# Patient Record
Sex: Female | Born: 1945 | Race: Black or African American | Hispanic: No | Marital: Married | Smoking: Never smoker
Health system: Southern US, Community
[De-identification: ages and names within clinical notes are randomized; demographics above are authoritative.]

## PROBLEM LIST (undated history)

## (undated) DIAGNOSIS — E785 Hyperlipidemia, unspecified: Secondary | ICD-10-CM

## (undated) DIAGNOSIS — I10 Essential (primary) hypertension: Secondary | ICD-10-CM

## (undated) DIAGNOSIS — F419 Anxiety disorder, unspecified: Secondary | ICD-10-CM

---

## 2010-02-07 ENCOUNTER — Emergency Department (HOSPITAL_COMMUNITY): Admission: EM | Admit: 2010-02-07 | Discharge: 2010-02-07 | Payer: Self-pay | Admitting: Emergency Medicine

## 2010-02-10 ENCOUNTER — Emergency Department (HOSPITAL_COMMUNITY): Admission: EM | Admit: 2010-02-10 | Discharge: 2010-02-10 | Payer: Self-pay | Admitting: Emergency Medicine

## 2010-02-25 ENCOUNTER — Emergency Department (HOSPITAL_BASED_OUTPATIENT_CLINIC_OR_DEPARTMENT_OTHER): Admission: EM | Admit: 2010-02-25 | Discharge: 2010-02-25 | Payer: Self-pay | Admitting: Emergency Medicine

## 2010-02-25 ENCOUNTER — Ambulatory Visit: Payer: Self-pay | Admitting: Radiology

## 2010-02-28 ENCOUNTER — Emergency Department (HOSPITAL_BASED_OUTPATIENT_CLINIC_OR_DEPARTMENT_OTHER): Admission: EM | Admit: 2010-02-28 | Discharge: 2010-02-28 | Payer: Self-pay | Admitting: Emergency Medicine

## 2010-08-18 ENCOUNTER — Encounter: Admission: RE | Admit: 2010-08-18 | Discharge: 2010-08-18 | Payer: Self-pay | Admitting: Neurosurgery

## 2010-12-17 ENCOUNTER — Observation Stay (HOSPITAL_COMMUNITY)
Admission: EM | Admit: 2010-12-17 | Discharge: 2010-12-18 | Payer: Self-pay | Source: Home / Self Care | Attending: Family Medicine | Admitting: Family Medicine

## 2010-12-18 LAB — COMPREHENSIVE METABOLIC PANEL
Albumin: 3.8 g/dL (ref 3.5–5.2)
Calcium: 9.6 mg/dL (ref 8.4–10.5)
Chloride: 106 mEq/L (ref 96–112)
Creatinine, Ser: 1.09 mg/dL (ref 0.4–1.2)
Total Bilirubin: 0.8 mg/dL (ref 0.3–1.2)
Total Protein: 7 g/dL (ref 6.0–8.3)

## 2010-12-18 LAB — DIFFERENTIAL
Eosinophils Absolute: 0.2 10*3/uL (ref 0.0–0.7)
Lymphocytes Relative: 45 % (ref 12–46)
Lymphs Abs: 2.5 10*3/uL (ref 0.7–4.0)
Neutrophils Relative %: 43 % (ref 43–77)

## 2010-12-18 LAB — LIPID PANEL
HDL: 69 mg/dL (ref >39)
Total CHOL/HDL Ratio: 2.5 RATIO
Triglycerides: 127 mg/dL (ref <150)

## 2010-12-18 LAB — CBC
MCH: 30 pg (ref 26.0–34.0)
MCHC: 34.3 g/dL (ref 30.0–36.0)
Platelets: 320 10*3/uL (ref 150–400)

## 2010-12-18 LAB — CARDIAC PANEL(CRET KIN+CKTOT+MB+TROPI)
CK, MB: 1.4 ng/mL (ref 0.3–4.0)
CK, MB: 1.4 ng/mL (ref 0.3–4.0)
Relative Index: INVALID (ref 0.0–2.5)
Total CK: 83 U/L (ref 7–177)
Troponin I: 0.02 ng/mL (ref 0.00–0.06)

## 2010-12-18 LAB — POCT I-STAT, CHEM 8
BUN: 18 mg/dL (ref 6–23)
Calcium, Ion: 1.15 mmol/L (ref 1.12–1.32)
Glucose, Bld: 151 mg/dL — ABNORMAL HIGH (ref 70–99)
HCT: 44 % (ref 36.0–46.0)
TCO2: 28 mmol/L (ref 0–100)

## 2010-12-18 LAB — POCT CARDIAC MARKERS: Myoglobin, poc: 62.7 ng/mL (ref 12–200)

## 2010-12-18 LAB — HEMOGLOBIN A1C: Mean Plasma Glucose: 131 mg/dL — ABNORMAL HIGH (ref <117)

## 2010-12-18 LAB — PROTIME-INR: Prothrombin Time: 12.4 seconds (ref 11.6–15.2)

## 2010-12-18 LAB — CK TOTAL AND CKMB (NOT AT ARMC): CK, MB: 1.6 ng/mL (ref 0.3–4.0)

## 2010-12-19 LAB — BASIC METABOLIC PANEL
BUN: 15 mg/dL (ref 6–23)
Chloride: 109 mEq/L (ref 96–112)
Sodium: 143 mEq/L (ref 135–145)

## 2010-12-21 NOTE — H&P (Signed)
NAME:  Shelly Murphy, Shelly Murphy NO.:  [id number]  MEDICAL RECORD NO.:  [id number]          PATIENT TYPE:  OBV  LOCATION:  1825                         FACILITY:  [facility]  PHYSICIAN:  Randie Tallarico A. Sheffield Slider, M.D.    DATE OF BIRTH:  23-Jan-1946  DATE OF ADMISSION:  12/17/2010 DATE OF DISCHARGE:                             HISTORY & PHYSICAL   PRIMARY CARE PROVIDER:  Dr. Morrie Sheldon in [place], [place].  CHIEF COMPLAINT:  Chest pain.  HISTORY OF PRESENT ILLNESS:  This is a 65 year old African American female presenting with left-sided chest pain.  The pain is under the left breast, started acutely around 1700 on December 16, 2010 while she was eating her dinner.  The patient describes the pain as a "pressure" that she confirmed goes down her left arm when asked.  It is 5/10.  The patient is not reproducible or aggravated by breathing.  Lying down makes the pain worse.  The patient feels the pain may be attributable to"gas" and feels a good belch may alleviate it.  She is not sure whether the aspirin or the nitro the patient received in the ED made the pain better, though the pain was resolved at the time of interview.  The patient experienced some nausea when given the aspirin.  Also experiencing some "burning" down her legs.  She experienced similar burning while taking Zocor in the past; her reflux and burning stopped after discontinuing this medication.  She has been taking Lipitor for the past week.  Her last use was yesterday.  OTHER REVIEW OF SYSTEMS:  Palpitations, dysuria.  Negative review of systems for edema, vomiting, constipation (last bowel movement yesterday), abdominal pain, acid taste in mouth.  ALLERGIES:  AMOXICILLIN, NEXIUM, VITAMIN D ANALOG.  MEDICATIONS: 1. Allopurinol 300 mg daily. 2. Buspirone 10 mg t.i.d. 3. Lisinopril 20 mg daily. 4. KCl 40 mEq daily. 5. Prevacid questionable 15 mg daily. 6. Procardia 60 mg daily. 7. Lipitor 10 mg daily.  PAST MEDICAL  HISTORY: 1. Hypertension. 2. Anxiety. 3. GERD. 4. Gout. 5. Hypercholesterolemia. 6. Hypokalemia. 7. Meningioma, stable, last MRI September 2011, followed by Dr. Channing Mutters.  PAST SURGICAL HISTORY:  Colonic polyps removed several years ago, not cancerous but dysplastic.  SOCIAL HISTORY:  Lives alone in [place].  Daughter moved out a few months ago.  Unemployed currently, laid off from [employer] in 2009.  Denies tobacco, alcohol, and drugs.  FAMILY HISTORY:  Mother died of old age at age 10.  Father died from questionable stroke at 79.  Siblings; hypertension.  Sister with lymphoma.  No cardiac disease or diabetes.  PHYSICAL EXAMINATION:  VITAL SIGNS:  Temperature 98.5, heart rate 100, respiratory rate 20, blood pressure 140/56, 100% on room air. GENERAL:  Not in apparent distress, sitting comfortably up in bed. HEENT:  Moist mucous membranes. CARDIOVASCULAR:  Regular rate and rhythm, no murmurs or gallops. PULMONARY:  Clear to auscultation bilaterally.  No rales, nontender to palpation. ABDOMEN:  Obese, normoactive bowel sounds, soft, nontender, nondistended. EXTREMITIES:  Pedal pulses 2+; 0 to 1+ pretibial edema.  No tenderness to palpation.  Sensation and strength intact. NEURO:  Alert and oriented, grossly intact. SKIN:  Warm, 1- to 2-second cap refill.  Good skin turgor.  LABORATORY DATA AND STUDIES: 1. CBC; white blood count 5.6, hemoglobin 14.1, platelets 320. 2. INR 0.90, PT 12.4. 3. An iSTAT sodium 140, potassium 3.1, chloride 103, creatinine 1.3,     glucose 151. 4. Point-of-care cardiac enzymes less than 0.05. 5. Chest x-ray; no acute process, lungs clear, heart size normal. 6. ECG; heart rate is 100, normal sinus rhythm, left axis deviation,     normal P, QRS interval, ST and T.  ASSESSMENT AND PLAN:  This is a 65 year old African American female with a history of hypertension, anxiety, GERD, and hypercholesterolemia presenting with acute left-sided, radiating chest  pain. 1. Atypical chest pain.  Differential diagnoses:  Angina/myocardial     infarction, gastroesophageal reflux disease, anxiety.  Admit to     [facility] Medicine Teaching Service to floor bed on telemetry.  We     will rule out myocardial infarction by cycling cardiac enzymes and     repeating ECG in the a.m.  We will order an echo.  We will risk     stratify with hemoglobin A1c, fasting lipid panel, and TSH.  We     will start Protonix for gastroesophageal reflux disease.  We will     continue home buspirone for anxiety.  We will give Ativan p.r.n.     for anxiety. 2. Burning in lower extremities.  May be due to hypokalemia.  We will     give 40 mEq KCL x3.  We will check CMET now.  Unlikely a     possibility, but we will consider checking CK in case of     rhabdomyolysis from statin if pain does not disappear or gets     worse.  We will check LFTs now. 3. Dysuria.  We will check UA and urine culture. 4. Hypertension.  We will continue home lisinopril.  We will follow up     on home dose Procardia and restart.  We will give hydralazine for     systolic blood pressures greater than 180 as needed in the     meantime. 5. Gout.  We will continue home allopurinol. 6. Anxiety.  We will continue home buspirone.  Ativan p.r.n. 7. Hypercholesterolemia.  We will check fasting lipid panel.  We will     restart statin if LFTs are normal and suspicion for rhabdomyolysis     remains low. 8. FEB/GI.  Hypokalemia.  We will replete with 40 mEq KCL x3.  We will     check potassium in CMET now.  No IV fluids needed at this time.     Diet; regular, heart healthy. 9. Prophylaxis.  Heparin subcu for deep vein thrombosis prophylaxis.     Protonix for gastroesophageal reflux disease. 10.Disposition.  Pending negative cardiac workup.    ______________________________ [name], MD   ______________________________ Arnette Norris Sheffield Slider, M.D.    AO/MEDQ  D:  12/17/2010  T:  12/17/2010  Job:   [id number]  Electronically Signed by [name] MD on 12/21/2010 06:16:00 PM Electronically Signed by Zachery Dauer M.D. on 12/21/2010 07:28:39 PM

## 2010-12-25 NOTE — Discharge Summary (Signed)
NAME:  Shelly Murphy, LITTRELL NO.:  [id number]  MEDICAL RECORD NO.:  [id number]          PATIENT TYPE:  OBV  LOCATION:  2006                         FACILITY:  [facility]  PHYSICIAN:  [name] Lerry Cordrey, M.D.DATE OF BIRTH:  [birth year]  DATE OF ADMISSION:  12/17/2010 DATE OF DISCHARGE:  12/18/2010                              DISCHARGE SUMMARY   PRIMARY CARE PROVIDER:  Dr. Morrie Sheldon in [place], [place].  REASON FOR HOSPITALIZATION:  Left-sided chest pain.  DISCHARGE DIAGNOSES: 1. Chest pain, negative workup for myocardial infarction. 2. Gastroesophageal reflux disease. 3. Elevated A1c, At-Risk of Diabetes Mellitus Type 2.   DISCHARGE MEDICATIONS:  New medications: 1. Aspirin 81 mg p.o. daily. 2. Lisinopril 10 mg p.o. daily. 3. Nitroglycerin sublingual 0.4 mg sublingual q.5 minutes p.r.n. chest     pain up to 3 times. 4. Prevacid 30 mg p.o. q.a.m. for acid reflux.  Continued home medications: 1. Allopurinol 300 mg p.o. at noon. 2. Buspirone 10 mg p.o. b.i.d. 3. Ibuprofen 800 mg p.o. b.i.d. p.r.n. pain. 4. Lipitor 10 mg p.o. every evening. 5. Potassium chloride 40 mEq p.o. at noon. 6. Antifungal cream over-the-counter 1 application topically b.i.d.     under arms and under buttocks.  Discontinued medications: 1. Lisinopril/hydrochlorothiazide 20/25. 2. Procardia XL 60 mg.  CONSULTS:  None.  PROCEDURES:  ECHO showed ejection fraction of 65-70%.  Grade 1 diastolic dysfunction.  Good systolic function.  Mild mitral valve regurgitation.  PERTINENT LABORATORY DATA:  Potassium on admission was low at 3.1. Potassium went up was 3.9 at the time of discharge.  Cardiac enzymes were negative x3.  TSH 2.128.  Hemoglobin A1c mildly elevated at 6.2. Fasting lipid panel normal with cholesterol 170, triglyceride 127, HDL 69, LDL 76.  BRIEF HOSPITAL COURSE:  This is a 65 year old African American female with history of hypertension, GERD, anxiety, and  hyperlipidemia presenting with left-sided chest pain. 1. Chest pain rule out:  The patient was admitted to the [facility]     Medicine Teaching Service and given a floor bed on telemetry.  Her     cardiac enzymes were negative x3.  Her ECG showed normal sinus     rhythm with normal intervals and no ST changes or T-wave     abnormalities.  Her repeat ECG the next morning was unchanged from     previous.  An echo showed grade 1 diastolic dysfunction with mild     mitral valve regurgitation and good systolic function.  The     patient's TSH and fasting lipid panel were within normal limits.     The patient's chest pain was located under her left breast and was     not reproducible.  The chest pain resolved by hospital day #2.  A     GI cocktail may have helped the chest pain.  At the time of     discharge, the patient was started on a baby aspirin and her     Prevacid dose was doubled.  Her chest pain may be due to angina or     GERD.  It is recommended that the patient  have a stress test done     outside the hospital within the next few days.  The patient's     primary care provider, Dr. Morrie Sheldon was called and he said that he would     refer her for a stress test.  Since the patient does not have     insurance, it was thought that her PCP would be better able to     refer her for the stress test.  The patient has an appointment with     her PCP the day following the day of discharge. 2. Burning in bilateral lower extremities:  The patient reported a     burning sensation in her legs.  Her potassium was low on admission     at 3.1.  Her potassium was repleted with supplements and the     burning resolved. 3. Hypertension:  After the patient was admitted, the patient was only     started on a very low dose of her lisinopril.  The patient's blood     pressure did fine with systolics in the 110s to 130s and diastolics     in the 70s to 80s, just on this one medication.  The patient's     Procardia  and hydrochlorothiazide were held, and the patient was     asked to not take these medications at the time of discharge. 4. Gout:  The patient was not having any gout symptoms and was     continued on her home allopurinol. 5. Anxiety:  The patient has a history of anxiety and was continued on     her home buspirone. 6. Hypercholesterolemia:  The patient's pravastatin was initially held     due to concern that the burning in her lower extremities may be due     to myalgias as a result of the statin.  The patient's creatine     kinase levels were normal however and since the burning sensation     resolved with potassium repletion, the patient was told to continue     taking the statin at the time of discharge.  DISCHARGE INSTRUCTIONS:  The patient was asked to make an appointment with her PCP, Dr. Morrie Sheldon tomorrow, December 19, 2010.  Dr. Morrie Sheldon will refer the patient for a stress test to evaluate for angina.  The patient was also given a prescription for nitroglycerin and advised to take it as needed for chest pain and advised to come to the emergency room if her chest pain did not resolve with one dose of the nitroglycerin.  FOLLOWUP ISSUES: 1. Referral for stress test to evaluate for angina. 2. Consider repeat potassium as the patient's potassium was low on     admission despite the patient taking her supplements.  Consider     checking magnesium levels as possible cause for low potassium.  The patient was discharged home in stable medical condition.   ______________________________ Shelly Mann, MD   ______________________________ [name] Shelly Murphy, M.D.   AO/MEDQ  D:  12/18/2010  T:  12/19/2010  Job:  [id number]  cc:   Dr. Morrie Sheldon  Electronically Signed by Shelly Mann MD on 12/21/2010 06:16:16 PM Electronically Signed by Acquanetta Belling M.D. on 12/25/2010 12:04:30 PM

## 2010-12-30 ENCOUNTER — Emergency Department (HOSPITAL_COMMUNITY)
Admission: EM | Admit: 2010-12-30 | Discharge: 2010-12-30 | Disposition: A | Discharge status: Home / Self Care | Payer: Self-pay | Attending: Emergency Medicine | Admitting: Emergency Medicine

## 2010-12-30 DIAGNOSIS — R5383 Other fatigue: Secondary | ICD-10-CM | POA: Insufficient documentation

## 2010-12-30 DIAGNOSIS — Z79899 Other long term (current) drug therapy: Secondary | ICD-10-CM | POA: Insufficient documentation

## 2010-12-30 DIAGNOSIS — R002 Palpitations: Secondary | ICD-10-CM | POA: Insufficient documentation

## 2010-12-30 DIAGNOSIS — R209 Unspecified disturbances of skin sensation: Secondary | ICD-10-CM | POA: Insufficient documentation

## 2010-12-30 DIAGNOSIS — R6883 Chills (without fever): Secondary | ICD-10-CM | POA: Insufficient documentation

## 2010-12-30 DIAGNOSIS — I10 Essential (primary) hypertension: Secondary | ICD-10-CM | POA: Insufficient documentation

## 2010-12-30 DIAGNOSIS — R5381 Other malaise: Secondary | ICD-10-CM | POA: Insufficient documentation

## 2010-12-30 DIAGNOSIS — K219 Gastro-esophageal reflux disease without esophagitis: Secondary | ICD-10-CM | POA: Insufficient documentation

## 2010-12-30 DIAGNOSIS — R350 Frequency of micturition: Secondary | ICD-10-CM | POA: Insufficient documentation

## 2010-12-30 DIAGNOSIS — R197 Diarrhea, unspecified: Secondary | ICD-10-CM | POA: Insufficient documentation

## 2010-12-30 DIAGNOSIS — R1013 Epigastric pain: Secondary | ICD-10-CM | POA: Insufficient documentation

## 2010-12-30 DIAGNOSIS — E7800 Pure hypercholesterolemia, unspecified: Secondary | ICD-10-CM | POA: Insufficient documentation

## 2010-12-30 LAB — URINALYSIS, ROUTINE W REFLEX MICROSCOPIC
Bilirubin Urine: NEGATIVE
Ketones, ur: NEGATIVE mg/dL
Nitrite: NEGATIVE
pH: 6.5 (ref 5.0–8.0)

## 2010-12-30 LAB — POCT I-STAT, CHEM 8
Chloride: 107 mEq/L (ref 96–112)
Creatinine, Ser: 1.2 mg/dL (ref 0.4–1.2)
HCT: 41 % (ref 36.0–46.0)
Sodium: 141 mEq/L (ref 135–145)
TCO2: 26 mmol/L (ref 0–100)

## 2011-01-02 LAB — URINE CULTURE: Culture  Setup Time: 201202060927

## 2011-02-02 ENCOUNTER — Emergency Department (HOSPITAL_COMMUNITY)
Admission: EM | Admit: 2011-02-02 | Discharge: 2011-02-02 | Disposition: A | Discharge status: Home / Self Care | Payer: Self-pay | Attending: Emergency Medicine | Admitting: Emergency Medicine

## 2011-02-02 DIAGNOSIS — M79609 Pain in unspecified limb: Secondary | ICD-10-CM | POA: Insufficient documentation

## 2011-02-02 DIAGNOSIS — IMO0002 Reserved for concepts with insufficient information to code with codable children: Secondary | ICD-10-CM | POA: Insufficient documentation

## 2011-02-02 DIAGNOSIS — E7800 Pure hypercholesterolemia, unspecified: Secondary | ICD-10-CM | POA: Insufficient documentation

## 2011-02-02 DIAGNOSIS — K219 Gastro-esophageal reflux disease without esophagitis: Secondary | ICD-10-CM | POA: Insufficient documentation

## 2011-02-02 DIAGNOSIS — X58XXXA Exposure to other specified factors, initial encounter: Secondary | ICD-10-CM | POA: Insufficient documentation

## 2011-02-02 DIAGNOSIS — I10 Essential (primary) hypertension: Secondary | ICD-10-CM | POA: Insufficient documentation

## 2011-02-13 LAB — URINALYSIS, ROUTINE W REFLEX MICROSCOPIC
Bilirubin Urine: NEGATIVE
Ketones, ur: NEGATIVE mg/dL
Nitrite: NEGATIVE
Nitrite: NEGATIVE
Protein, ur: NEGATIVE mg/dL
Specific Gravity, Urine: 1.007 (ref 1.005–1.030)
Urobilinogen, UA: 0.2 mg/dL (ref 0.0–1.0)
Urobilinogen, UA: 0.2 mg/dL (ref 0.0–1.0)
pH: 7 (ref 5.0–8.0)

## 2011-02-13 LAB — COMPREHENSIVE METABOLIC PANEL
AST: 42 U/L — ABNORMAL HIGH (ref 0–37)
Albumin: 4 g/dL (ref 3.5–5.2)
Alkaline Phosphatase: 84 U/L (ref 39–117)
CO2: 28 mEq/L (ref 19–32)
Chloride: 107 mEq/L (ref 96–112)
Creatinine, Ser: 0.9 mg/dL (ref 0.4–1.2)
GFR calc Af Amer: >60 mL/min (ref >60)
GFR calc non Af Amer: >60 mL/min (ref >60)
Potassium: 3.3 mEq/L — ABNORMAL LOW (ref 3.5–5.1)
Total Bilirubin: 0.8 mg/dL (ref 0.3–1.2)

## 2011-02-13 LAB — BASIC METABOLIC PANEL
Calcium: 9.5 mg/dL (ref 8.4–10.5)
Chloride: 107 mEq/L (ref 96–112)
Creatinine, Ser: 0.8 mg/dL (ref 0.4–1.2)
GFR calc Af Amer: >60 mL/min (ref >60)
GFR calc non Af Amer: >60 mL/min (ref >60)

## 2011-02-13 LAB — DIFFERENTIAL
Basophils Absolute: 0.1 10*3/uL (ref 0.0–0.1)
Basophils Relative: 3 % — ABNORMAL HIGH (ref 0–1)
Eosinophils Absolute: 0 10*3/uL (ref 0.0–0.7)
Eosinophils Relative: 1 % (ref 0–5)
Lymphocytes Relative: 23 % (ref 12–46)
Lymphocytes Relative: 34 % (ref 12–46)
Lymphs Abs: 1.6 10*3/uL (ref 0.7–4.0)
Monocytes Absolute: 0.4 10*3/uL (ref 0.1–1.0)
Monocytes Relative: 8 % (ref 3–12)
Neutro Abs: 2.4 10*3/uL (ref 1.7–7.7)
Neutrophils Relative %: 53 % (ref 43–77)

## 2011-02-13 LAB — URINE CULTURE: Colony Count: 15000

## 2011-02-13 LAB — URINE MICROSCOPIC-ADD ON

## 2011-02-13 LAB — CBC
HCT: 42.5 % (ref 36.0–46.0)
MCV: 89.2 fL (ref 78.0–100.0)
MCV: 89.4 fL (ref 78.0–100.0)
Platelets: 314 10*3/uL (ref 150–400)
RBC: 4.76 MIL/uL (ref 3.87–5.11)
RBC: 4.87 MIL/uL (ref 3.87–5.11)
WBC: 4.6 10*3/uL (ref 4.0–10.5)
WBC: 6 10*3/uL (ref 4.0–10.5)

## 2011-02-13 LAB — MAGNESIUM: Magnesium: 1.9 mg/dL (ref 1.5–2.5)

## 2011-02-13 LAB — POCT CARDIAC MARKERS
Myoglobin, poc: 59.5 ng/mL (ref 12–200)
Troponin i, poc: <0.05 ng/mL (ref 0.00–0.09)

## 2011-02-13 LAB — D-DIMER, QUANTITATIVE: D-Dimer, Quant: 0.52 ug/mL-FEU — ABNORMAL HIGH (ref 0.00–0.48)

## 2011-02-18 LAB — POCT CARDIAC MARKERS
Myoglobin, poc: 88.8 ng/mL (ref 12–200)
Troponin i, poc: <0.05 ng/mL (ref 0.00–0.09)

## 2011-02-18 LAB — COMPREHENSIVE METABOLIC PANEL
ALT: 61 U/L — ABNORMAL HIGH (ref 0–35)
AST: 42 U/L — ABNORMAL HIGH (ref 0–37)
Albumin: 3.8 g/dL (ref 3.5–5.2)
Alkaline Phosphatase: 72 U/L (ref 39–117)
Alkaline Phosphatase: 72 U/L (ref 39–117)
BUN: 7 mg/dL (ref 6–23)
CO2: 29 mEq/L (ref 19–32)
CO2: 29 mEq/L (ref 19–32)
Calcium: 9.9 mg/dL (ref 8.4–10.5)
Chloride: 100 mEq/L (ref 96–112)
GFR calc Af Amer: >60 mL/min (ref >60)
GFR calc non Af Amer: 56 mL/min — ABNORMAL LOW (ref >60)
GFR calc non Af Amer: >60 mL/min (ref >60)
Glucose, Bld: 146 mg/dL — ABNORMAL HIGH (ref 70–99)
Potassium: 2.8 mEq/L — ABNORMAL LOW (ref 3.5–5.1)
Potassium: 2.8 mEq/L — ABNORMAL LOW (ref 3.5–5.1)
Sodium: 140 mEq/L (ref 135–145)
Total Bilirubin: 1 mg/dL (ref 0.3–1.2)
Total Protein: 7.8 g/dL (ref 6.0–8.3)

## 2011-02-18 LAB — DIFFERENTIAL
Basophils Absolute: 0 10*3/uL (ref 0.0–0.1)
Basophils Relative: 0 % (ref 0–1)
Basophils Relative: 1 % (ref 0–1)
Eosinophils Absolute: 0 10*3/uL (ref 0.0–0.7)
Eosinophils Relative: 1 % (ref 0–5)
Lymphs Abs: 1.6 10*3/uL (ref 0.7–4.0)
Monocytes Absolute: 0.3 10*3/uL (ref 0.1–1.0)
Monocytes Relative: 9 % (ref 3–12)
Neutro Abs: 2.1 10*3/uL (ref 1.7–7.7)
Neutrophils Relative %: 56 % (ref 43–77)

## 2011-02-18 LAB — URINALYSIS, ROUTINE W REFLEX MICROSCOPIC
Glucose, UA: NEGATIVE mg/dL
Hgb urine dipstick: NEGATIVE
Ketones, ur: NEGATIVE mg/dL
Protein, ur: NEGATIVE mg/dL

## 2011-02-18 LAB — CBC
HCT: 43 % (ref 36.0–46.0)
Hemoglobin: 15 g/dL (ref 12.0–15.0)
Hemoglobin: 15.1 g/dL — ABNORMAL HIGH (ref 12.0–15.0)
MCHC: 33.5 g/dL (ref 30.0–36.0)
RBC: 4.78 MIL/uL (ref 3.87–5.11)
RBC: 4.97 MIL/uL (ref 3.87–5.11)
WBC: 3.7 10*3/uL — ABNORMAL LOW (ref 4.0–10.5)
WBC: 4.2 10*3/uL (ref 4.0–10.5)

## 2011-02-18 LAB — URINE MICROSCOPIC-ADD ON

## 2011-03-11 ENCOUNTER — Emergency Department (HOSPITAL_COMMUNITY)
Admission: EM | Admit: 2011-03-11 | Discharge: 2011-03-11 | Disposition: A | Discharge status: Home / Self Care | Payer: Self-pay | Attending: Emergency Medicine | Admitting: Emergency Medicine

## 2011-03-11 ENCOUNTER — Emergency Department (HOSPITAL_COMMUNITY): Payer: Self-pay

## 2011-03-11 DIAGNOSIS — D320 Benign neoplasm of cerebral meninges: Secondary | ICD-10-CM | POA: Insufficient documentation

## 2011-03-11 DIAGNOSIS — E7800 Pure hypercholesterolemia, unspecified: Secondary | ICD-10-CM | POA: Insufficient documentation

## 2011-03-11 DIAGNOSIS — I10 Essential (primary) hypertension: Secondary | ICD-10-CM | POA: Insufficient documentation

## 2011-03-11 DIAGNOSIS — R51 Headache: Secondary | ICD-10-CM | POA: Insufficient documentation

## 2011-03-11 DIAGNOSIS — I4460 Unspecified fascicular block: Secondary | ICD-10-CM | POA: Insufficient documentation

## 2011-03-11 DIAGNOSIS — R42 Dizziness and giddiness: Secondary | ICD-10-CM | POA: Insufficient documentation

## 2011-03-11 LAB — URINALYSIS, ROUTINE W REFLEX MICROSCOPIC
Glucose, UA: NEGATIVE mg/dL
pH: 7 (ref 5.0–8.0)

## 2011-03-11 LAB — POCT CARDIAC MARKERS
CKMB, poc: <1 ng/mL — ABNORMAL LOW (ref 1.0–8.0)
Myoglobin, poc: 69.2 ng/mL (ref 12–200)
Myoglobin, poc: 53.7 ng/mL (ref 12–200)
Troponin i, poc: <0.05 ng/mL (ref 0.00–0.09)

## 2011-03-11 LAB — DIFFERENTIAL
Basophils Relative: 1 % (ref 0–1)
Eosinophils Absolute: 0 10*3/uL (ref 0.0–0.7)
Eosinophils Relative: 1 % (ref 0–5)
Monocytes Relative: 9 % (ref 3–12)
Neutrophils Relative %: 64 % (ref 43–77)

## 2011-03-11 LAB — POCT I-STAT, CHEM 8
BUN: 20 mg/dL (ref 6–23)
Chloride: 101 mEq/L (ref 96–112)
HCT: 49 % — ABNORMAL HIGH (ref 36.0–46.0)
Sodium: 141 mEq/L (ref 135–145)
TCO2: 30 mmol/L (ref 0–100)

## 2011-03-11 LAB — CBC
HCT: 46.2 % — ABNORMAL HIGH (ref 36.0–46.0)
Hemoglobin: 16.1 g/dL — ABNORMAL HIGH (ref 12.0–15.0)
MCV: 86.8 fL (ref 78.0–100.0)
RBC: 5.32 MIL/uL — ABNORMAL HIGH (ref 3.87–5.11)

## 2011-03-11 MED ORDER — GADOBENATE DIMEGLUMINE 529 MG/ML IV SOLN
20.0000 mL | Freq: Once | INTRAVENOUS | Status: AC | PRN
  Administered 2011-03-11: 20 mL via INTRAVENOUS

## 2011-08-01 IMAGING — CT CT HEAD W/O CM
3 of 4 series · 16 of 40 positions shown, 19 images · non-contrast
Comparison: None.

CT HEAD

CLINICAL DATA: Numbness in face and hand, neck pain for 2 months

CT HEAD WITHOUT CONTRAST
CT CERVICAL SPINE WITHOUT CONTRAST
TECHNIQUE: Multidetector CT imaging of the head and cervical spine
was performed following the standard protocol without intravenous
contrast.  Multiplanar CT image reconstructions of the cervical
spine were also generated.

[Series 3: head_seq 4.5 h37s st · axial · 0.43mm/px · z∈[+1246,+1282]mm · 2 of 32 slices shown]
[im 8/32  brain]
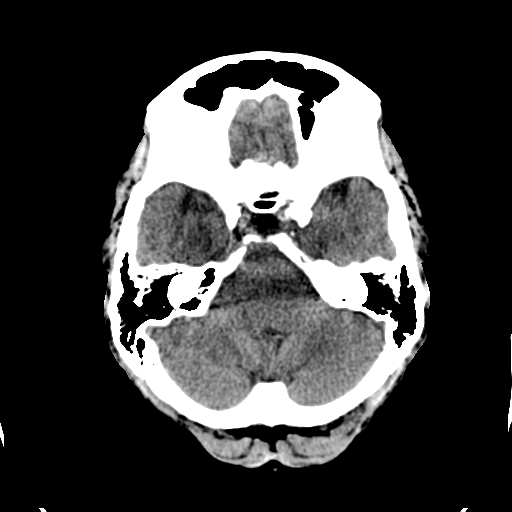
[im 16/32  brain]
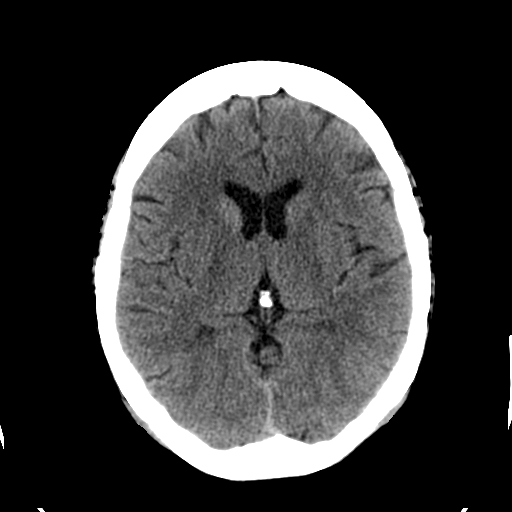

[Series 602: <mpr thick range> · coronal · 0.31mm/px · 3 of 49 slices shown]
[im 17/49  brain]
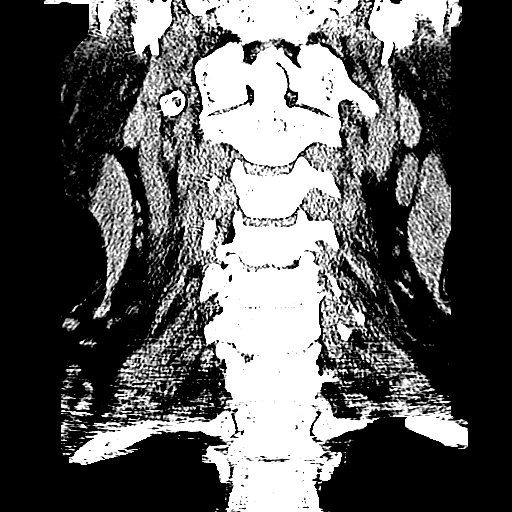
[im 22/49  brain]
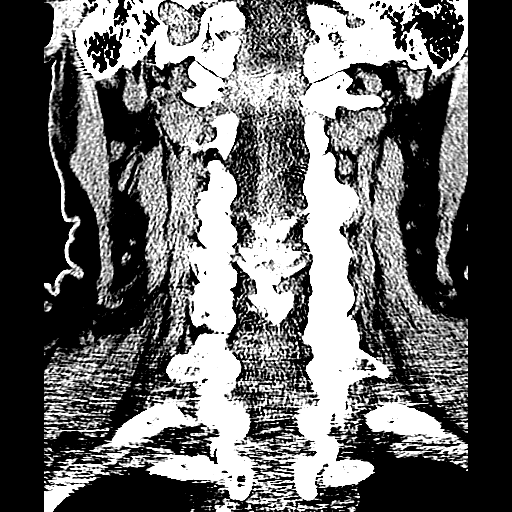
[im 27/49  brain]
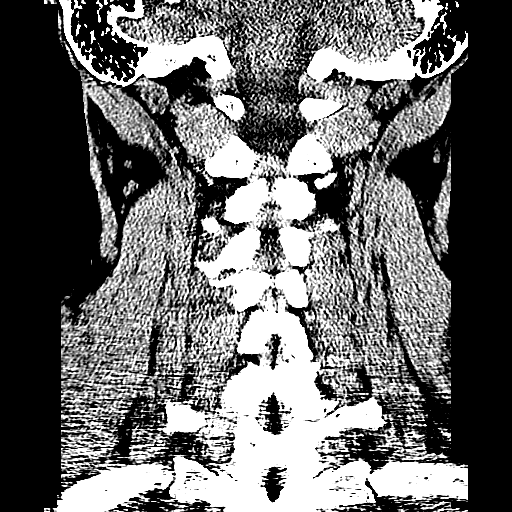

[Series 603: <mpr thick range(1)> · axial · 0.31mm/px · z∈[+1037,+1152]mm · 11 of 82 slices shown, 14 images]
[im 7/82  brain]
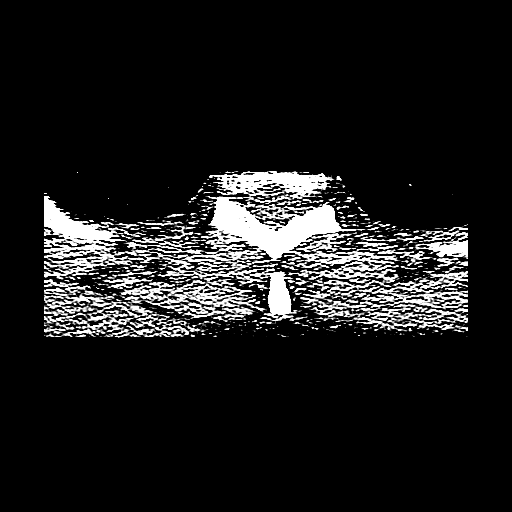
[im 7/82  bone]
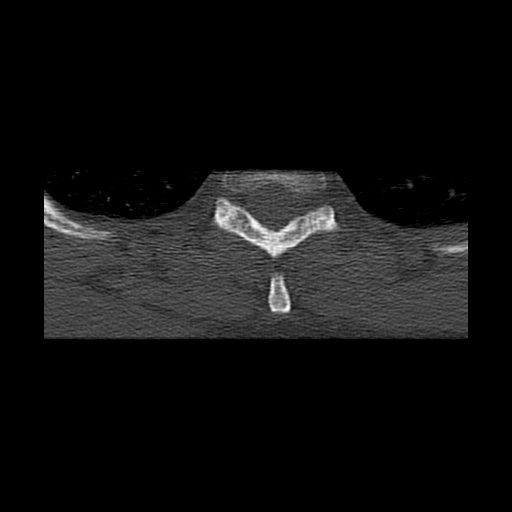
[im 14/82  brain]
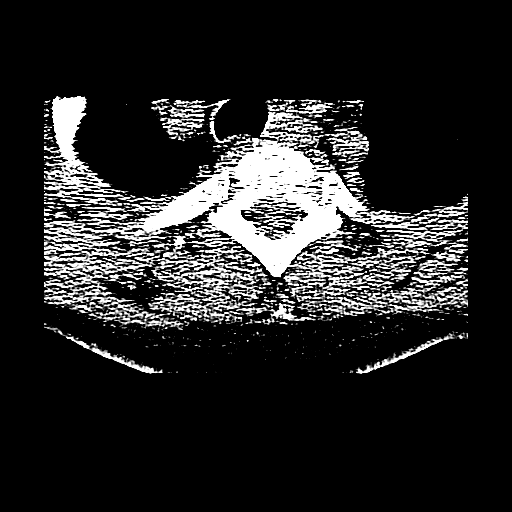
[im 21/82  brain]
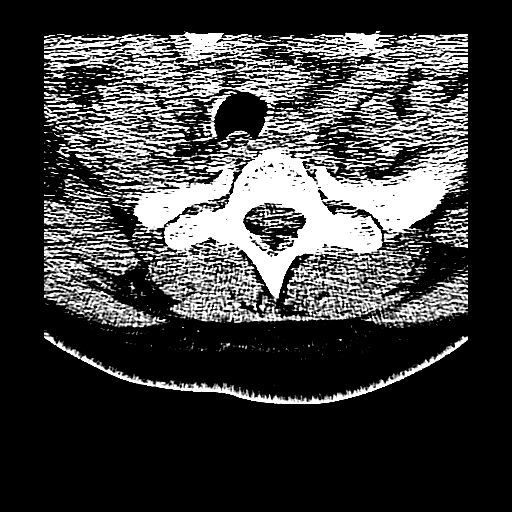
[im 28/82  brain]
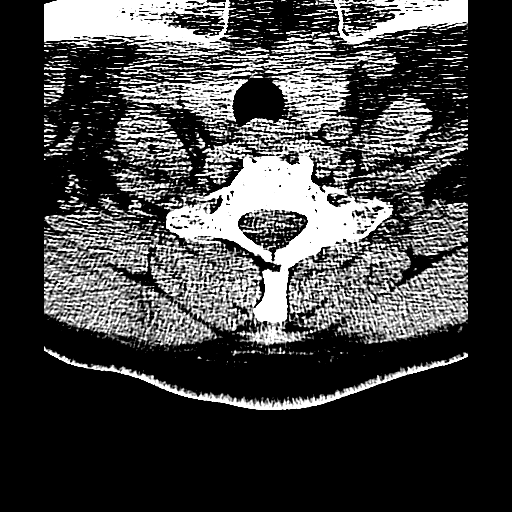
[im 34/82  brain]
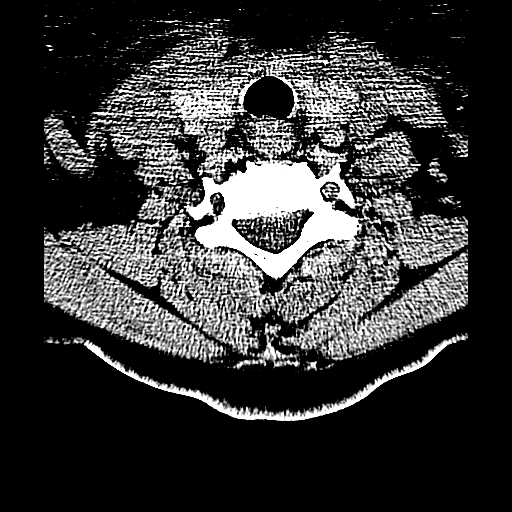
[im 34/82  bone]
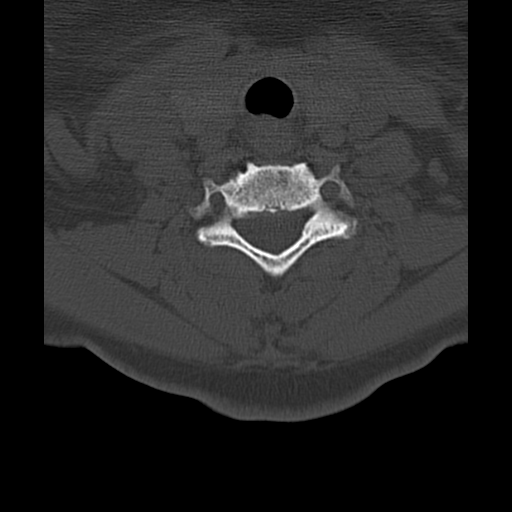
[im 41/82  brain]
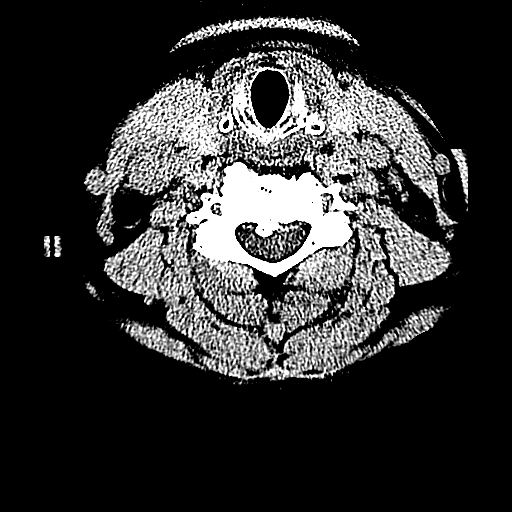
[im 48/82  brain]
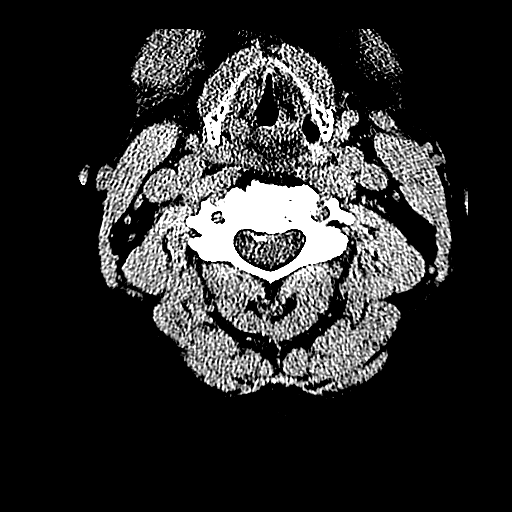
[im 55/82  brain]
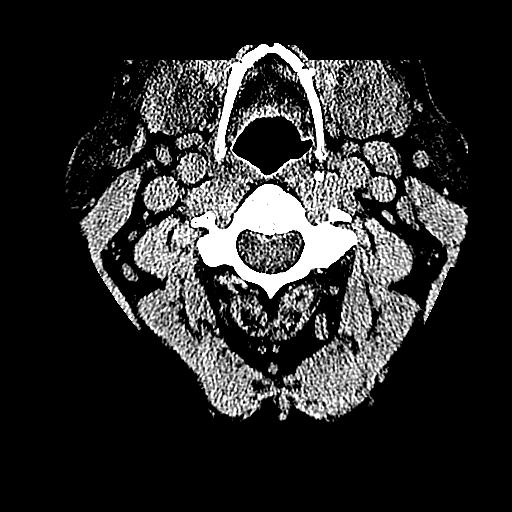
[im 61/82  brain]
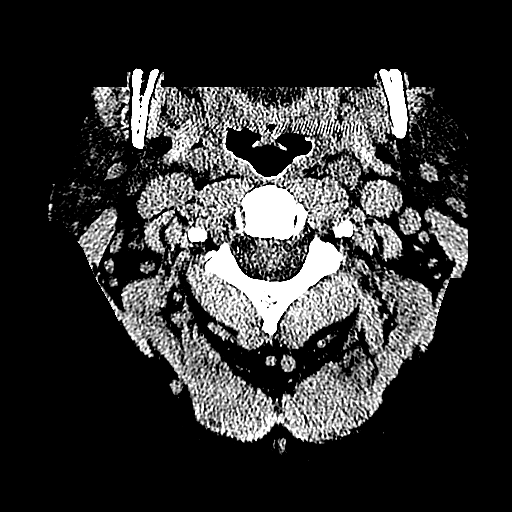
[im 61/82  bone]
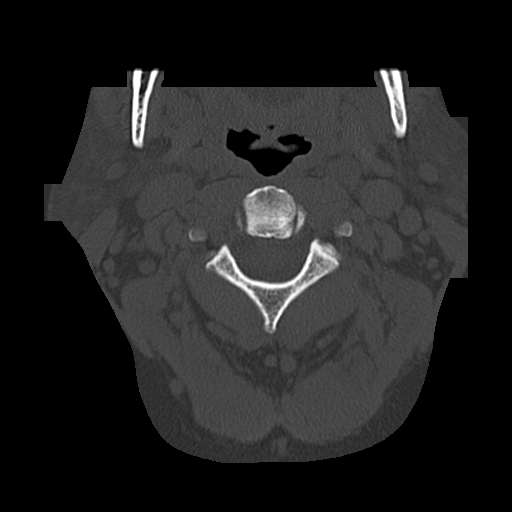
[im 68/82  brain]
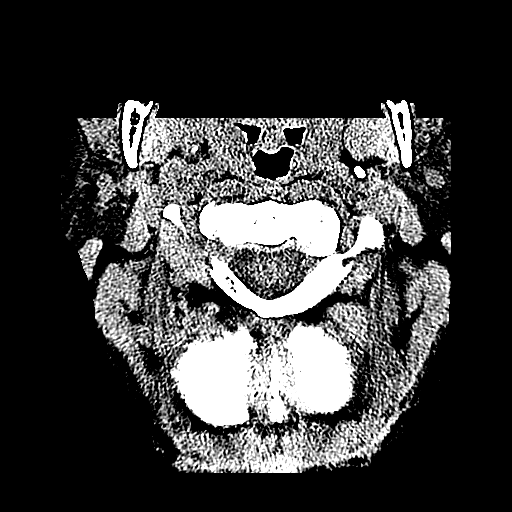
[im 75/82  brain]
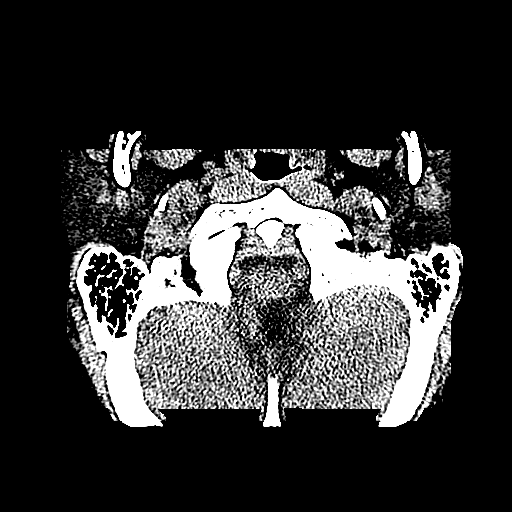

[16 of 40 positions shown; findings below may reference images not displayed]

FINDINGS: No evidence of acute intracranial hemorrhage.  There is a
flame shaped low attenuation region within the subcortical white
matter of the medial right frontal lobe just inferior to the
ventricle which is asymmetric.  Paramedian increased attenuation
adjacent is likely volume averaging from bony excrescence at the
sella.    No evidence of mass effect in this region.  No
hydrocephalus or midline shift.

Paranasal sinuses and  mastoid air cells are clear.  Orbits are
normal.
IMPRESSION: 1.  White matter hypoattenuation within the right frontal lobe
could represent an acute white matter infarction.  Cannot complete
exclude edema from occult mass.  Recommend brain MRI with and
without contrast for further evaluation.

CT CERVICAL SPINE
FINDINGS: No prevertebral soft tissue swelling.  There is
straightening of the normal cervical lordosis.  There is loss of
disc space and osteophytosis most severe at the C4-C6.  Normal
facet articulation.  Normal craniocervical junction.  No evidence
of epidural or paraspinal hematoma.
IMPRESSION: 1.  No acute cervical spine findings.
2.  Severe disc osteophytic disease with anterior and posterior
osteophytes from C4-C6.

## 2011-08-04 IMAGING — CR DG CHEST 2V
2 series · 2 of 2 positions shown · non-contrast
Comparison: None.

CLINICAL DATA: Epigastric pain

CHEST - 2 VIEW

[w chest pa]
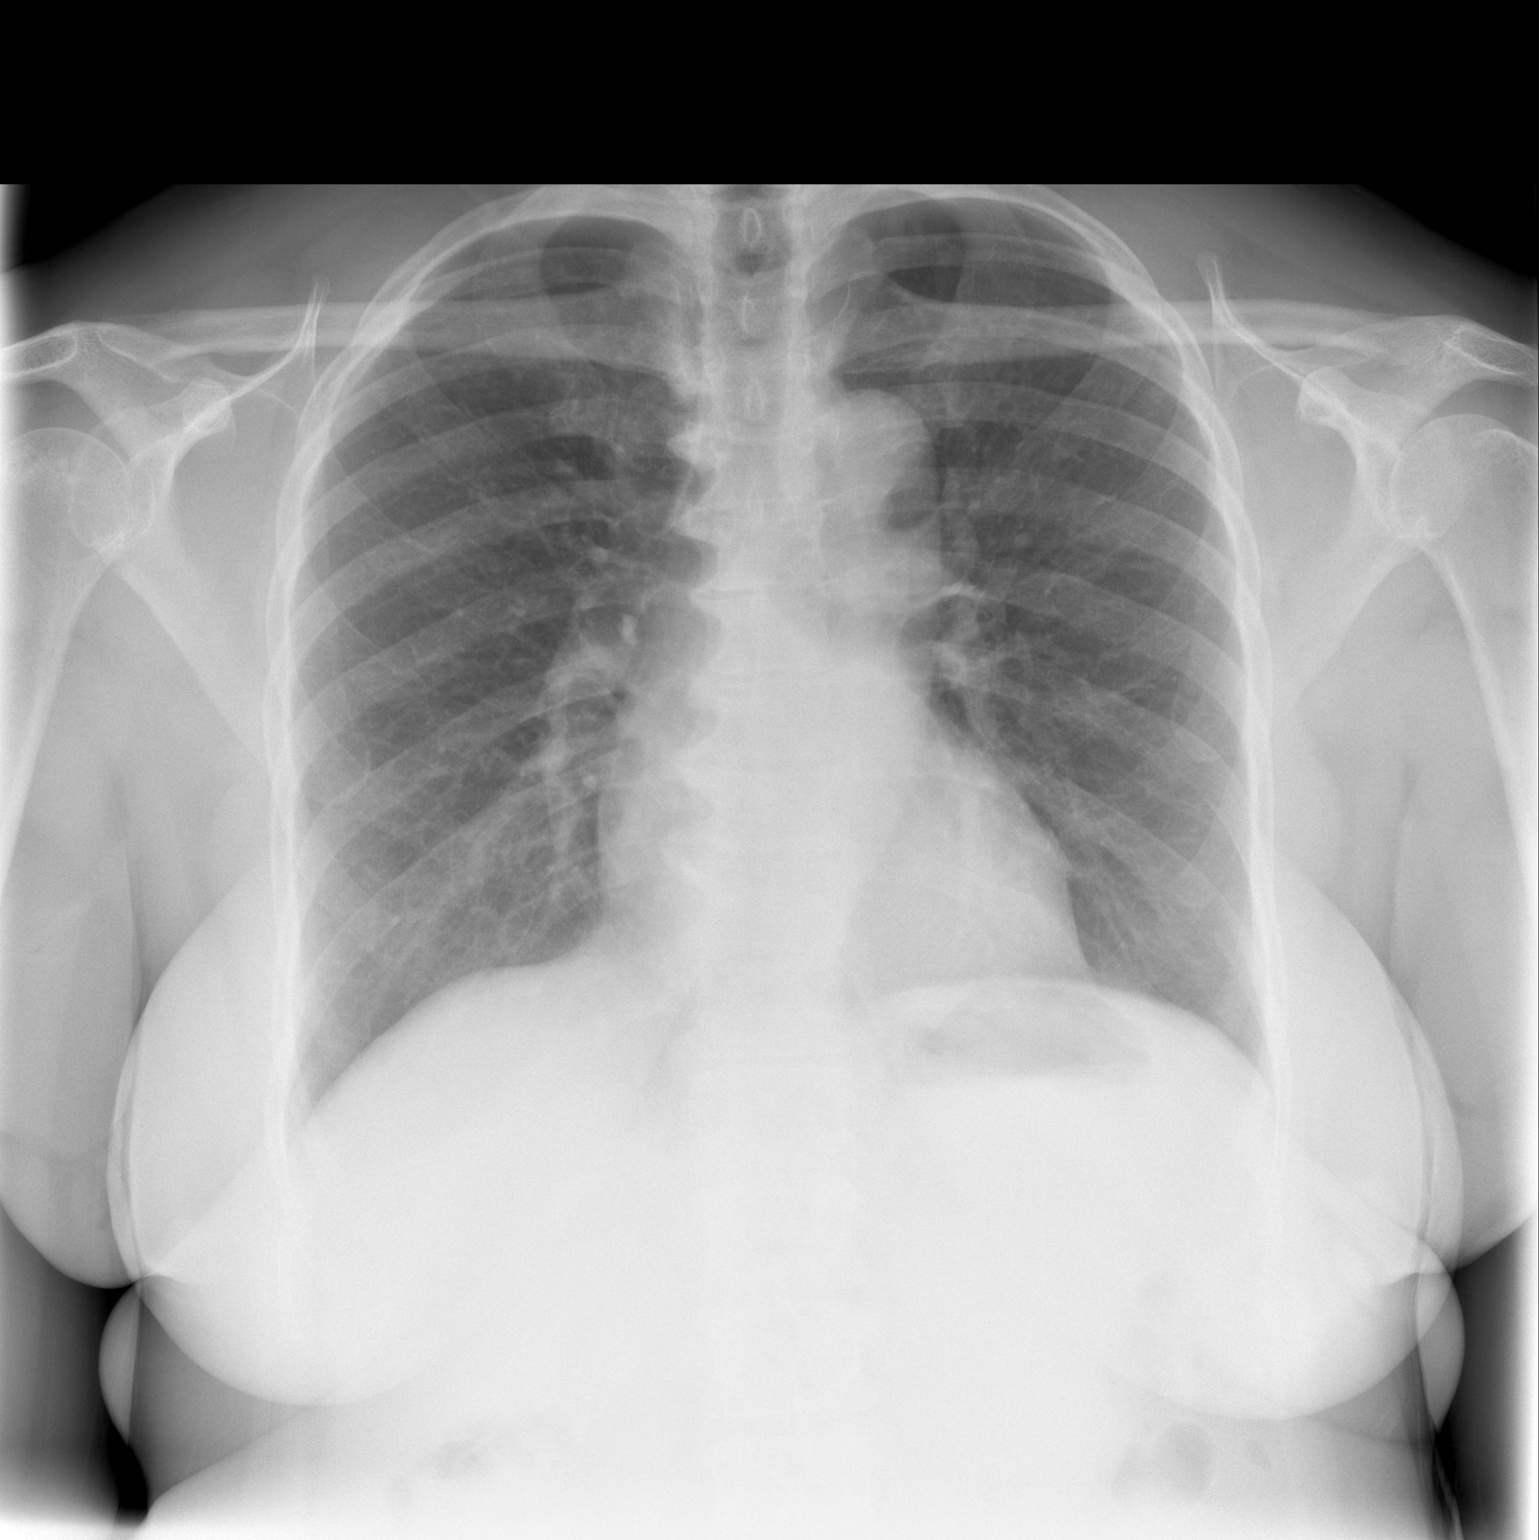

[w chest lat]
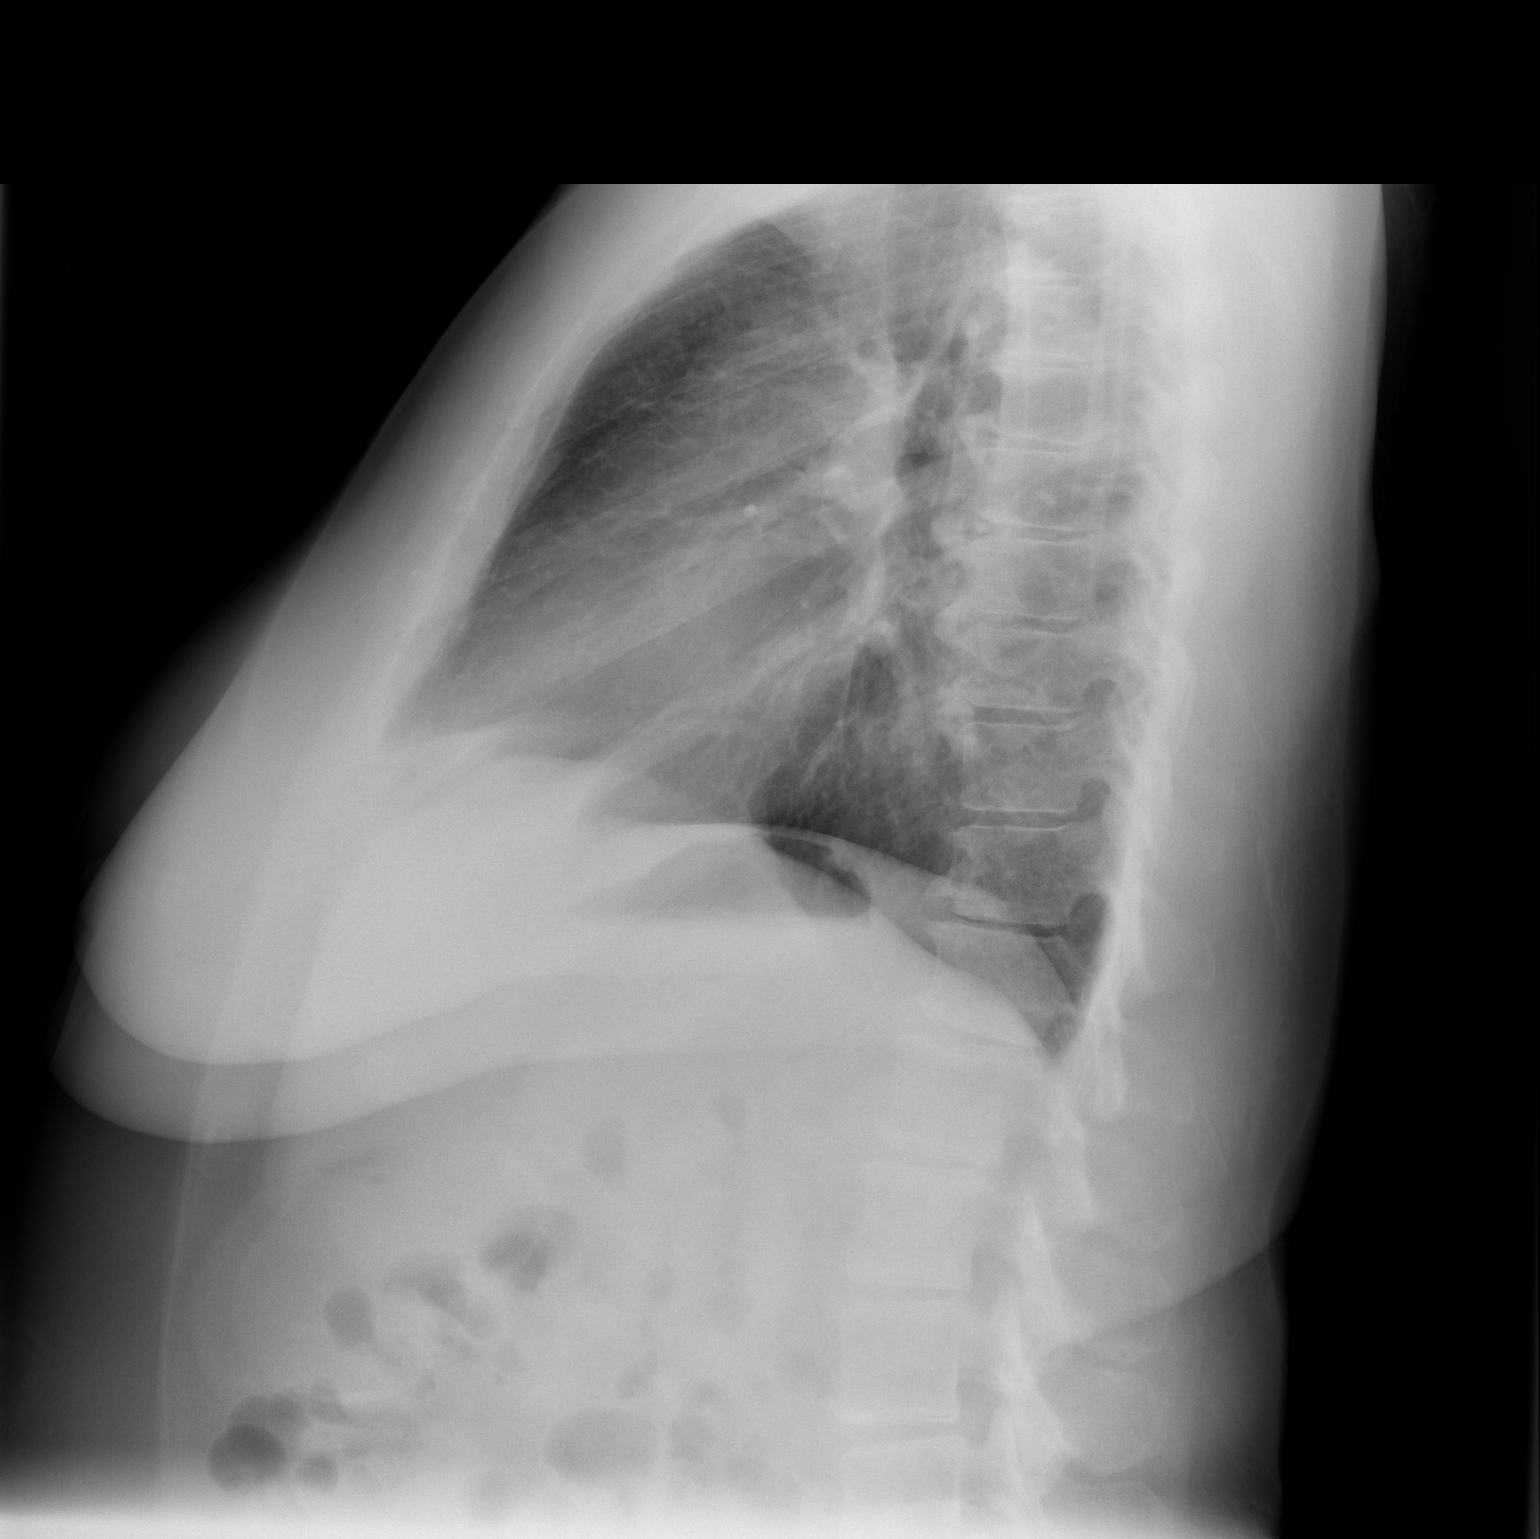

[2 of 2 positions shown; findings below may reference images not displayed]

FINDINGS: The lungs are clear.  The heart is within normal limits
in size.  There are diffuse degenerative changes throughout the
thoracic spine.
IMPRESSION: No active lung disease.

## 2012-02-13 ENCOUNTER — Emergency Department (HOSPITAL_COMMUNITY)
Admission: EM | Admit: 2012-02-13 | Discharge: 2012-02-13 | Disposition: A | Discharge status: Home Health | Payer: Self-pay | Attending: Emergency Medicine | Admitting: Emergency Medicine

## 2012-02-13 ENCOUNTER — Encounter (HOSPITAL_COMMUNITY): Payer: Self-pay | Admitting: Physical Medicine and Rehabilitation

## 2012-02-13 DIAGNOSIS — I10 Essential (primary) hypertension: Secondary | ICD-10-CM | POA: Insufficient documentation

## 2012-02-13 DIAGNOSIS — E785 Hyperlipidemia, unspecified: Secondary | ICD-10-CM | POA: Insufficient documentation

## 2012-02-13 DIAGNOSIS — M791 Myalgia, unspecified site: Secondary | ICD-10-CM

## 2012-02-13 DIAGNOSIS — IMO0001 Reserved for inherently not codable concepts without codable children: Secondary | ICD-10-CM | POA: Insufficient documentation

## 2012-02-13 DIAGNOSIS — M109 Gout, unspecified: Secondary | ICD-10-CM | POA: Insufficient documentation

## 2012-02-13 HISTORY — DX: Anxiety disorder, unspecified: F41.9

## 2012-02-13 HISTORY — DX: Essential (primary) hypertension: I10

## 2012-02-13 HISTORY — DX: Hyperlipidemia, unspecified: E78.5

## 2012-02-13 LAB — POCT I-STAT, CHEM 8
BUN: 19 mg/dL (ref 6–23)
Chloride: 106 mEq/L (ref 96–112)
Creatinine, Ser: 1 mg/dL (ref 0.50–1.10)
Hemoglobin: 15.6 g/dL — ABNORMAL HIGH (ref 12.0–15.0)
Potassium: 4.3 mEq/L (ref 3.5–5.1)
Sodium: 142 mEq/L (ref 135–145)

## 2012-02-13 MED ORDER — NAPROXEN 500 MG PO TABS: 500.0000 mg | ORAL_TABLET | Freq: Two times a day (BID) | ORAL | Status: AC

## 2012-02-13 MED ORDER — NAPROXEN 500 MG PO TABS
500.0000 mg | ORAL_TABLET | Freq: Once | ORAL | Status: AC
  Administered 2012-02-13: 500 mg via ORAL
  Filled 2012-02-13: qty 1

## 2012-02-13 MED ORDER — DIAZEPAM 2 MG PO TABS: 2.0000 mg | ORAL_TABLET | Freq: Four times a day (QID) | ORAL | Status: AC | PRN

## 2012-02-13 NOTE — ED Notes (Signed)
Pt presents to department for evaluation of L sided shoulder pain. Onset Tuesday. Pt states cramping/muscles aches to L shoulder. Pain became worse this morning. 9/10 pain, becomes worse with movement. Denies recent injury. Ambulatory to triage. No signs of distress noted.

## 2012-02-13 NOTE — ED Notes (Signed)
Pt states frequent myalgia from taking cholesterol medication.

## 2012-02-13 NOTE — ED Provider Notes (Signed)
Medical screening examination/treatment/procedure(s) were performed by non-physician practitioner and as supervising physician I was immediately available for consultation/collaboration.   Jiah Bari A. Patrica Duel, MD 02/13/12 2149

## 2012-02-13 NOTE — Discharge Instructions (Signed)
Please use the medication as prescribed and follow up with your doctor tomorrow as planned.  Read the information below.  You may return to the ER at any time for worsening condition or any new symptoms that concern you.   Musculoskeletal Pain Musculoskeletal pain is muscle and boney aches and pains. These pains can occur in any part of the body. Your caregiver may treat you without knowing the cause of the pain. They may treat you if blood or urine tests, X-rays, and other tests were normal.  CAUSES There is often not a definite cause or reason for these pains. These pains may be caused by a type of germ (virus). The discomfort may also come from overuse. Overuse includes working out too hard when your body is not fit. Boney aches also come from weather changes. Bone is sensitive to atmospheric pressure changes. HOME CARE INSTRUCTIONS   Ask when your test results will be ready. Make sure you get your test results.   Only take over-the-counter or prescription medicines for pain, discomfort, or fever as directed by your caregiver. If you were given medications for your condition, do not drive, operate machinery or power tools, or sign legal documents for 24 hours. Do not drink alcohol. Do not take sleeping pills or other medications that may interfere with treatment.   Continue all activities unless the activities cause more pain. When the pain lessens, slowly resume normal activities. Gradually increase the intensity and duration of the activities or exercise.   During periods of severe pain, bed rest may be helpful. Lay or sit in any position that is comfortable.   Putting ice on the injured area.   Put ice in a bag.   Place a towel between your skin and the bag.   Leave the ice on for 15 to 20 minutes, 3 to 4 times a day.   Follow up with your caregiver for continued problems and no reason can be found for the pain. If the pain becomes worse or does not go away, it may be necessary to  repeat tests or do additional testing. Your caregiver may need to look further for a possible cause.  SEEK IMMEDIATE MEDICAL CARE IF:  You have pain that is getting worse and is not relieved by medications.   You develop chest pain that is associated with shortness or breath, sweating, feeling sick to your stomach (nauseous), or throw up (vomit).   Your pain becomes localized to the abdomen.   You develop any new symptoms that seem different or that concern you.  MAKE SURE YOU:   Understand these instructions.   Will watch your condition.   Will get help right away if you are not doing well or get worse.  Document Released: 11/11/2005 Document Revised: 10/31/2011 Document Reviewed: 07/01/2008 [facility] Patient Information 2012 [place], [place].

## 2012-02-13 NOTE — ED Provider Notes (Signed)
History     CSN: [id number]  Arrival date & time 02/13/12  1003   First MD Initiated Contact with Patient 02/13/12 1150      Chief Complaint  Patient presents with  . Shoulder Pain    (Consider location/radiation/quality/duration/timing/severity/associated sxs/prior treatment) HPI Comments: Patient reports left upper back pain x 2 days.  States the pain is constant and is worse with moving her arm, attempting to raise her arms.  Pain is described as a soreness.  States that she has been on various statin drugs in the past and switched to a new one a year ago because it was causing extreme muscle aches and cramps.  States that this feeling is similar but not as severe.  Has an appointment with her PCP in [place], [place] tomorrow but could not stand the discomfort any longer.  Patient has not taken anything for her pain.  Denies fevers, chest pain, SOB, recent injury or heavy lifting, falls or accidents.  Denies muscle pain elsewhere.    The history is provided by the patient.    Past Medical History  Diagnosis Date  . Hypertension   . Anxiety   . Gout   . Hyperlipemia     History reviewed. No pertinent past surgical history.  History reviewed. No pertinent family history.  History  Substance Use Topics  . Smoking status: Never Smoker   . Smokeless tobacco: Not on file  . Alcohol Use: No    OB History    Grav Para Term Preterm Abortions TAB SAB Ect Mult Living                  Review of Systems  All other systems reviewed and are negative.    Allergies  Vitamin d analogs  Home Medications   Current Outpatient Rx  Name Route Sig Dispense Refill  . ALLOPURINOL 300 MG PO TABS Oral Take 300 mg by mouth daily.    . ASPIRIN EC 81 MG PO TBEC Oral Take 81 mg by mouth daily.    . ATORVASTATIN CALCIUM 10 MG PO TABS Oral Take 10 mg by mouth at bedtime.    . BUSPIRONE HCL 10 MG PO TABS Oral Take 10 mg by mouth 2 (two) times daily.    Marland Kitchen LANSOPRAZOLE 15 MG PO CPDR Oral Take 15  mg by mouth daily.    Marland Kitchen LISINOPRIL-HYDROCHLOROTHIAZIDE 20-25 MG PO TABS Oral Take 1 tablet by mouth daily.    Marland Kitchen MAGNESIUM HYDROXIDE 800 MG/5ML PO SUSP Oral Take 30 mLs by mouth daily as needed. gas    . POTASSIUM CHLORIDE CRYS ER 20 MEQ PO TBCR Oral Take 40 mEq by mouth daily.      BP 127/70  Pulse 92  Temp(Src) 97.9 F (36.6 C) (Oral)  Resp 18  SpO2 100%  Physical Exam  Nursing note and vitals reviewed. Constitutional: She is oriented to person, place, and time. She appears well-developed and well-nourished.  HENT:  Head: Normocephalic and atraumatic.  Neck: Neck supple.  Pulmonary/Chest: Effort normal. She exhibits no tenderness.  Musculoskeletal: She exhibits tenderness. She exhibits no edema.       Left shoulder: She exhibits decreased range of motion. She exhibits no bony tenderness, no swelling, no effusion, no crepitus, no deformity, normal pulse and normal strength.       Arms:      Bilateral grip strengths equal.  Decreased strength of left shoulder only with elevation, otherwise normal.    Neurological: She is alert and oriented  to person, place, and time.    ED Course  Procedures (including critical care time)  Labs Reviewed  POCT I-STAT, CHEM 8 - Abnormal; Notable for the following:    Glucose, Bld 103 (*)    Hemoglobin 15.6 (*)    All other components within normal limits   No results found.   1. Muscle soreness       MDM  Patient with isolated muscular soreness of left upper back associated with shoulder movement and with palpation.  Patient has not suffered any injury, but has had similar symptoms from statin.  She has an appointment with her PCP tomorrow for follow up.  Patient is not having any chest pain and SOB or other concerning symptoms.  She has no spinal tenderness.  Doubt radiculopathy, doubt ACS, doubt pulmonary pathology.  Pt d/c home with NSAID and muscle relaxant.  Patient verbalizes understanding and agrees with plan.          Rise Patience, [place] 02/13/12 1601

## 2013-06-04 ENCOUNTER — Encounter: Payer: Self-pay | Admitting: Internal Medicine

## 2013-07-13 ENCOUNTER — Encounter: Payer: Self-pay | Admitting: Internal Medicine

## 2013-07-28 ENCOUNTER — Encounter: Payer: Self-pay | Admitting: Internal Medicine

## 2013-08-11 ENCOUNTER — Other Ambulatory Visit: Payer: Self-pay | Admitting: Neurosurgery

## 2013-08-11 DIAGNOSIS — D329 Benign neoplasm of meninges, unspecified: Secondary | ICD-10-CM

## 2013-08-19 ENCOUNTER — Ambulatory Visit
Admission: RE | Admit: 2013-08-19 | Discharge: 2013-08-19 | Disposition: A | Discharge status: Home / Self Care | Payer: Medicare Other | Source: Ambulatory Visit | Attending: Neurosurgery | Admitting: Neurosurgery

## 2013-08-19 DIAGNOSIS — D329 Benign neoplasm of meninges, unspecified: Secondary | ICD-10-CM

## 2013-08-19 MED ORDER — GADOBENATE DIMEGLUMINE 529 MG/ML IV SOLN
19.0000 mL | Freq: Once | INTRAVENOUS | Status: AC | PRN
  Administered 2013-08-19: 19 mL via INTRAVENOUS

## 2014-02-15 ENCOUNTER — Emergency Department (HOSPITAL_COMMUNITY): Payer: Medicare Other

## 2014-02-15 ENCOUNTER — Encounter (HOSPITAL_COMMUNITY): Payer: Self-pay | Admitting: Emergency Medicine

## 2014-02-15 ENCOUNTER — Emergency Department (HOSPITAL_COMMUNITY)
Admission: EM | Admit: 2014-02-15 | Discharge: 2014-02-15 | Disposition: A | Discharge status: Home / Self Care | Payer: Medicare Other | Attending: Emergency Medicine | Admitting: Emergency Medicine

## 2014-02-15 DIAGNOSIS — R5383 Other fatigue: Secondary | ICD-10-CM

## 2014-02-15 DIAGNOSIS — R300 Dysuria: Secondary | ICD-10-CM | POA: Insufficient documentation

## 2014-02-15 DIAGNOSIS — R0789 Other chest pain: Secondary | ICD-10-CM | POA: Insufficient documentation

## 2014-02-15 DIAGNOSIS — R42 Dizziness and giddiness: Secondary | ICD-10-CM | POA: Insufficient documentation

## 2014-02-15 DIAGNOSIS — R079 Chest pain, unspecified: Secondary | ICD-10-CM

## 2014-02-15 DIAGNOSIS — Z7982 Long term (current) use of aspirin: Secondary | ICD-10-CM | POA: Insufficient documentation

## 2014-02-15 DIAGNOSIS — R0602 Shortness of breath: Secondary | ICD-10-CM | POA: Insufficient documentation

## 2014-02-15 DIAGNOSIS — R259 Unspecified abnormal involuntary movements: Secondary | ICD-10-CM | POA: Insufficient documentation

## 2014-02-15 DIAGNOSIS — R5381 Other malaise: Secondary | ICD-10-CM | POA: Insufficient documentation

## 2014-02-15 DIAGNOSIS — Z79899 Other long term (current) drug therapy: Secondary | ICD-10-CM | POA: Insufficient documentation

## 2014-02-15 DIAGNOSIS — R51 Headache: Secondary | ICD-10-CM | POA: Insufficient documentation

## 2014-02-15 DIAGNOSIS — E785 Hyperlipidemia, unspecified: Secondary | ICD-10-CM | POA: Insufficient documentation

## 2014-02-15 DIAGNOSIS — Z8659 Personal history of other mental and behavioral disorders: Secondary | ICD-10-CM | POA: Insufficient documentation

## 2014-02-15 DIAGNOSIS — R531 Weakness: Secondary | ICD-10-CM

## 2014-02-15 DIAGNOSIS — R251 Tremor, unspecified: Secondary | ICD-10-CM

## 2014-02-15 DIAGNOSIS — I10 Essential (primary) hypertension: Secondary | ICD-10-CM | POA: Insufficient documentation

## 2014-02-15 LAB — BASIC METABOLIC PANEL
BUN: 28 mg/dL — AB (ref 6–23)
CO2: 23 mEq/L (ref 19–32)
CREATININE: 1.23 mg/dL — AB (ref 0.50–1.10)
Calcium: 10.3 mg/dL (ref 8.4–10.5)
Chloride: 99 mEq/L (ref 96–112)
GFR calc Af Amer: 52 mL/min — ABNORMAL LOW (ref >90)
GFR calc non Af Amer: 45 mL/min — ABNORMAL LOW (ref >90)
GLUCOSE: 107 mg/dL — AB (ref 70–99)
POTASSIUM: 3.7 meq/L (ref 3.7–5.3)
Sodium: 138 mEq/L (ref 137–147)

## 2014-02-15 LAB — CBC
HEMATOCRIT: 40.7 % (ref 36.0–46.0)
HEMOGLOBIN: 14.2 g/dL (ref 12.0–15.0)
MCH: 30.4 pg (ref 26.0–34.0)
MCHC: 34.9 g/dL (ref 30.0–36.0)
MCV: 87.2 fL (ref 78.0–100.0)
Platelets: 291 10*3/uL (ref 150–400)
RBC: 4.67 MIL/uL (ref 3.87–5.11)
RDW: 12.9 % (ref 11.5–15.5)
WBC: 5 10*3/uL (ref 4.0–10.5)

## 2014-02-15 LAB — I-STAT TROPONIN, ED: Troponin i, poc: 0 ng/mL (ref 0.00–0.08)

## 2014-02-15 MED ORDER — ESOMEPRAZOLE MAGNESIUM 40 MG PO CPDR: 40.0000 mg | DELAYED_RELEASE_CAPSULE | Freq: Every day | ORAL | Status: AC

## 2014-02-15 NOTE — Discharge Instructions (Signed)
Your caregiver has diagnosed you as having chest pain that is not specific for one problem, but does not require admission.  You are at low risk for an acute heart condition or other serious illness. Chest pain comes from many different causes.  SEEK IMMEDIATE MEDICAL ATTENTION IF: You have severe chest pain, especially if the pain is crushing or pressure-like and spreads to the arms, back, neck, or jaw, or if you have sweating, nausea (feeling sick to your stomach), or shortness of breath. THIS IS AN EMERGENCY. Don't wait to see if the pain will go away. Get medical help at once. Call 911 or 0 (operator). DO NOT drive yourself to the hospital.  Your chest pain gets worse and does not go away with rest.  You have an attack of chest pain lasting longer than usual, despite rest and treatment with the medications your caregiver has prescribed.  You wake from sleep with chest pain or shortness of breath.  You feel dizzy or faint.  You have chest pain not typical of your usual pain for which you originally saw your caregiver.  Chest Pain (Nonspecific) It is often hard to give a specific diagnosis for the cause of chest pain. There is always a chance that your pain could be related to something serious, such as a heart attack or a blood clot in the lungs. You need to follow up with your caregiver for further evaluation. CAUSES   Heartburn.  Pneumonia or bronchitis.  Anxiety or stress.  Inflammation around your heart (pericarditis) or lung (pleuritis or pleurisy).  A blood clot in the lung.  A collapsed lung (pneumothorax). It can develop suddenly on its own (spontaneous pneumothorax) or from injury (trauma) to the chest.  Shingles infection (herpes zoster virus). The chest wall is composed of bones, muscles, and cartilage. Any of these can be the source of the pain.  The bones can be bruised by injury.  The muscles or cartilage can be strained by coughing or overwork.  The cartilage can be  affected by inflammation and become sore (costochondritis). DIAGNOSIS  Lab tests or other studies, such as X-rays, electrocardiography, stress testing, or cardiac imaging, may be needed to find the cause of your pain.  TREATMENT   Treatment depends on what may be causing your chest pain. Treatment may include:  Acid blockers for heartburn.  Anti-inflammatory medicine.  Pain medicine for inflammatory conditions.  Antibiotics if an infection is present.  You may be advised to change lifestyle habits. This includes stopping smoking and avoiding alcohol, caffeine, and chocolate.  You may be advised to keep your head raised (elevated) when sleeping. This reduces the chance of acid going backward from your stomach into your esophagus.  Most of the time, nonspecific chest pain will improve within 2 to 3 days with rest and mild pain medicine. HOME CARE INSTRUCTIONS   If antibiotics were prescribed, take your antibiotics as directed. Finish them even if you start to feel better.  For the next few days, avoid physical activities that bring on chest pain. Continue physical activities as directed.  Do not smoke.  Avoid drinking alcohol.  Only take over-the-counter or prescription medicine for pain, discomfort, or fever as directed by your caregiver.  Follow your caregiver's suggestions for further testing if your chest pain does not go away.  Keep any follow-up appointments you made. If you do not go to an appointment, you could develop lasting (chronic) problems with pain. If there is any problem keeping an appointment,  you must call to reschedule. SEEK MEDICAL CARE IF:   You think you are having problems from the medicine you are taking. Read your medicine instructions carefully.  Your chest pain does not go away, even after treatment.  You develop a rash with blisters on your chest. SEEK IMMEDIATE MEDICAL CARE IF:   You have increased chest pain or pain that spreads to your arm,  neck, jaw, back, or abdomen.  You develop shortness of breath, an increasing cough, or you are coughing up blood.  You have severe back or abdominal pain, feel nauseous, or vomit.  You develop severe weakness, fainting, or chills.  You have a fever. THIS IS AN EMERGENCY. Do not wait to see if the pain will go away. Get medical help at once. Call your local emergency services (911 in [place] Do not drive yourself to the hospital. MAKE SURE YOU:   Understand these instructions.  Will watch your condition.  Will get help right away if you are not doing well or get worse. Document Released: 08/21/2005 Document Revised: 02/03/2012 Document Reviewed: 06/16/2008 [facility] Patient Information 2014 [place].  Weakness Weakness is a lack of strength. It may be felt all over the body (generalized) or in one specific part of the body (focal). Some causes of weakness can be serious. You may need further medical evaluation, especially if you are elderly or you have a history of immunosuppression (such as chemotherapy or HIV), kidney disease, heart disease, or diabetes. CAUSES  Weakness can be caused by many different things, including:  Infection.  Physical exhaustion.  Internal bleeding or other blood loss that results in a lack of red blood cells (anemia).  Dehydration. This cause is more common in elderly people.  Side effects or electrolyte abnormalities from medicines, such as pain medicines or sedatives.  Emotional distress, anxiety, or depression.  Circulation problems, especially severe peripheral arterial disease.  Heart disease, such as rapid atrial fibrillation, bradycardia, or heart failure.  Nervous system disorders, such as Guillain-Barr syndrome, multiple sclerosis, or stroke. DIAGNOSIS  To find the cause of your weakness, your caregiver will take your history and perform a physical exam. Lab tests or X-rays may also be ordered, if needed. TREATMENT  Treatment of  weakness depends on the cause of your symptoms and can vary greatly. HOME CARE INSTRUCTIONS   Rest as needed.  Eat a well-balanced diet.  Try to get some exercise every day.  Only take over-the-counter or prescription medicines as directed by your caregiver. SEEK MEDICAL CARE IF:   Your weakness seems to be getting worse or spreads to other parts of your body.  You develop new aches or pains. SEEK IMMEDIATE MEDICAL CARE IF:   You cannot perform your normal daily activities, such as getting dressed and feeding yourself.  You cannot walk up and down stairs, or you feel exhausted when you do so.  You have shortness of breath or chest pain.  You have difficulty moving parts of your body.  You have weakness in only one area of the body or on only one side of the body.  You have a fever.  You have trouble speaking or swallowing.  You cannot control your bladder or bowel movements.  You have black or bloody vomit or stools. MAKE SURE YOU:  Understand these instructions.  Will watch your condition.  Will get help right away if you are not doing well or get worse. Document Released: 11/11/2005 Document Revised: 05/12/2012 Document Reviewed: 01/10/2012 [employer] Patient Information  2014 [place], [place]. Tremor Tremor is a rhythmic, involuntary muscular contraction characterized by oscillations (to-and-fro movements) of a part of the body. The most common of all involuntary movements, tremor can affect various body parts such as the hands, head, facial structures, vocal cords, trunk, and legs; most tremors, however, occur in the hands. Tremor often accompanies neurological disorders associated with aging. Although the disorder is not life-threatening, it can be responsible for functional disability and social embarrassment. TREATMENT  There are many types of tremor and several ways in which tremor is classified. The most common classification is by behavioral context or position.  There are five categories of tremor within this classification: resting, postural, kinetic, task-specific, and psychogenic. Resting or static tremor occurs when the muscle is at rest, for example when the hands are lying on the lap. This type of tremor is often seen in patients with Parkinson's disease. Postural tremor occurs when a patient attempts to maintain posture, such as holding the hands outstretched. Postural tremors include physiological tremor, essential tremor, tremor with basal ganglia disease (also seen in patients with Parkinson's disease), cerebellar postural tremor, tremor with peripheral neuropathy, post-traumatic tremor, and alcoholic tremor. Kinetic or intention (action) tremor occurs during purposeful movement, for example during finger-to-nose testing. Task-specific tremor appears when performing goal-oriented tasks such as handwriting, speaking, or standing. This group consists of primary writing tremor, vocal tremor, and orthostatic tremor. Psychogenic tremor occurs in both older and younger patients. The key feature of this tremor is that it dramatically lessens or disappears when the patient is distracted. PROGNOSIS There are some treatment options available for tremor; the appropriate treatment depends on accurate diagnosis of the cause. Some tremors respond to treatment of the underlying condition, for example in some cases of psychogenic tremor treating the patient's underlying mental problem may cause the tremor to disappear. Also, patients with tremor due to Parkinson's disease may be treated with Levodopa drug therapy. Symptomatic drug therapy is available for several other tremors as well. For those cases of tremor in which there is no effective drug treatment, physical measures such as teaching the patient to brace the affected limb during the tremor are sometimes useful. Surgical intervention such as thalamotomy or deep brain stimulation may be useful in certain cases. Document  Released: 11/01/2002 Document Revised: 02/03/2012 Document Reviewed: 11/11/2005 [facility] Patient Information 2014 [place]. Esomeprazole capsules What is this medicine? ESOMEPRAZOLE (es oh ME pray zol) prevents the production of acid in the stomach. It is used to treat gastroesophageal reflux disease (GERD), ulcers, certain bacteria in the stomach, and inflammation of the esophagus. It can also be used to prevent ulcers in patients taking medicines called NSAIDs. You can also buy this medicine without a prescription to treat the symptoms of heartburn. The non-prescription product is not for long-term use, unless otherwise directed by your doctor or health care professional. This medicine may be used for other purposes; ask your health care provider or pharmacist if you have questions. COMMON BRAND NAME(S): Nexium What should I tell my health care provider before I take this medicine? They need to know if you have any of these conditions: -bloody or black, tarry stools -chest pain -have had heartburn for over 3 months -have heartburn with dizziness, lightheadedness, or sweating -liver disease -low levels of magnesium in the blood -nausea, vomiting -stomach pain -trouble swallowing -unexplained weight loss -vomiting with blood -wheezing -an unusual or allergic reaction to esomeprazole, other medicines, foods, dyes, or preservatives -pregnant or trying to get pregnant -breast-feeding How  should I use this medicine? Take this medicine by mouth. Swallow the capsules whole with a drink of water. Follow the directions on the prescription or product label. Do not crush, break or chew. The capsules can be opened and the contents sprinkled on applesauce. Do not crush the contents into the food. This medicine works best if taken on an empty stomach at least one hour before a meal. Take your medicine at regular intervals. Do not take your medicine more often than directed. Talk to your  pediatrician regarding the use of this medicine in children. Special care may be needed. Overdosage: If you think you have taken too much of this medicine contact a poison control center or emergency room at once. NOTE: This medicine is only for you. Do not share this medicine with others. What if I miss a dose? If you miss a dose, take it as soon as you can. If it is almost time for your next dose, take only that dose. Do not take double or extra doses. What may interact with this medicine? Do not take this medicine with any of the following medications: -atazanavir This medicine may also interact with the following medications: -ampicillin -antiviral medicines for HIV or AIDS -certain medicines for fungal infections like ketoconazole and itraconazole -cilostazol -clopidogrel -diazepam -digoxin -erlotinib -diuretics -iron salts -methotrexate -St. John's Wort -tacrolimus -rifampin -warfarin This list may not describe all possible interactions. Give your health care provider a list of all the medicines, herbs, non-prescription drugs, or dietary supplements you use. Also tell them if you smoke, drink alcohol, or use illegal drugs. Some items may interact with your medicine. What should I watch for while using this medicine? If you are taking this medicine without a prescription, it may take 1 to 4 days for it to fully relieve your heartburn.  If you are using this medicine with a prescription from your healthcare professional for a more serious condition, it can take several days before your condition gets better. Check with your doctor or health care professional if your condition does not start to get better, or if it gets worse. If you take this medicine for long periods of time, you may need blood work done. What side effects may I notice from receiving this medicine? Side effects that you should report to your doctor or health care professional as soon as possible: -allergic  reactions like skin rash, itching or hives, swelling of the face, lips, or tongue -bone, muscle or joint pain -breathing problems -chest pain or chest tightness -dark yellow or brown urine -fast, irregular heartbeat -feeling faint or lightheaded -fever or sore throat -muscle spasms -tremors -unusual bleeding or bruising -unusually weak or tired -upset stomach -yellowing of the eyes or skin Side effects that usually do not require medical attention (Report these to your doctor or health care professional if they continue or are bothersome.): -constipation -diarrhea -dry mouth -headache -nausea -stomach pain or gas -vomiting This list may not describe all possible side effects. Call your doctor for medical advice about side effects. You may report side effects to FDA at 1-800-FDA-1088. Where should I keep my medicine? Keep out of the reach of children. Store at room temperature between 15 and 30 degrees C (59 and 86 degrees F). Protect from light and moisture. Throw away any unused medicine after the expiration date. NOTE: This sheet is a summary. It may not cover all possible information. If you have questions about this medicine, talk to your doctor, pharmacist, or  health care provider.  2014, [employer]. (2013-02-23 14:58:20)

## 2014-02-15 NOTE — ED Provider Notes (Signed)
CSN: [id number]     Arrival date & time 02/15/14  2104 History   First MD Initiated Contact with Patient 02/15/14 2200     Chief Complaint  Patient presents with  . Chest Pain  . Tremors     (Consider location/radiation/quality/duration/timing/severity/associated sxs/prior Treatment) HPI Comments: Patient is a 68 year-old female presenting with complaints of chest pain and tremors for 2 hours. The chest pain and tremors began simultaneously while sitting at rest. The tremors have subsided but the chest pain persists. The pain is described as a constant substernal and left sided pressure that does not radiate, lasting 2 hours.  She states it is like her regular GERD but worse. She tried nothing to make it better, and she cannot identify aggravating factors. The patient has a history of vertigo and reports feeling dizzy at the onset of pain. Patient also reports shortness of breath but no cough. There has been a recent episode of bladder spasm. Patient denies fever, chills, syncope, trauma, abdominal pain, nausea, vomiting, diarrhea, constipation, and dysuria.  Patient is a 68 y.o. female presenting with chest pain. The history is provided by the patient. No language interpreter was used.  Chest Pain Pain location:  L chest and substernal area Pain quality: pressure   Pain quality: not radiating, not sharp and not stabbing   Pain radiates to:  Does not radiate Timing:  Constant Progression:  Improving Context: at rest   Relieved by:  None tried Associated symptoms: dizziness, headache and shortness of breath   Associated symptoms: no abdominal pain, no cough, no fever, no nausea, no palpitations, no syncope and not vomiting   Headaches:    Timing:  Constant Risk factors: high cholesterol and hypertension   Risk factors: no diabetes mellitus     Past Medical History  Diagnosis Date  . Hypertension   . Anxiety   . Gout   . Hyperlipemia    History reviewed. No pertinent past surgical  history. History reviewed. No pertinent family history. History  Substance Use Topics  . Smoking status: Never Smoker   . Smokeless tobacco: Never Used  . Alcohol Use: No   OB History   Grav Para Term Preterm Abortions TAB SAB Ect Mult Living                 Review of Systems  Constitutional: Negative for fever and chills.  HENT: Negative for sore throat.   Eyes: Negative for visual disturbance.  Respiratory: Positive for shortness of breath. Negative for cough and chest tightness.   Cardiovascular: Positive for chest pain. Negative for palpitations and syncope.  Gastrointestinal: Negative for nausea, vomiting, abdominal pain, diarrhea and constipation.  Genitourinary: Positive for difficulty urinating. Negative for dysuria.  Neurological: Positive for dizziness and headaches. Negative for syncope.      Allergies  Vitamin d analogs  Home Medications   Current Outpatient Rx  Name  Route  Sig  Dispense  Refill  . aspirin EC 81 MG tablet   Oral   Take 81 mg by mouth daily.         Marland Kitchen atorvastatin (LIPITOR) 10 MG tablet   Oral   Take 10 mg by mouth at bedtime.         Marland Kitchen BIOTIN PO   Oral   Take 2 tablets by mouth daily.         . lansoprazole (PREVACID) 15 MG capsule   Oral   Take 15 mg by mouth daily.         Marland Kitchen  lisinopril (PRINIVIL,ZESTRIL) 10 MG tablet   Oral   Take 10 mg by mouth daily.         Marland Kitchen lisinopril-hydrochlorothiazide (PRINZIDE,ZESTORETIC) 20-25 MG per tablet   Oral   Take 1 tablet by mouth daily.         . Multiple Vitamin (MULTIVITAMIN WITH MINERALS) TABS tablet   Oral   Take 1 tablet by mouth daily.         . potassium chloride SA (K-DUR,KLOR-CON) 20 MEQ tablet   Oral   Take 40 mEq by mouth daily.         . magnesium hydroxide (MILK OF MAGNESIA) 800 MG/5ML suspension   Oral   Take 30 mLs by mouth daily as needed. gas          BP 135/69  Pulse 91  Temp(Src) 98.4 F (36.9 C) (Oral)  Resp 14  Ht 5\' 4"  (1.626 m)  Wt  207 lb (93.895 kg)  BMI 35.51 kg/m2  SpO2 99% Physical Exam  Nursing note and vitals reviewed. Constitutional: She is oriented to person, place, and time. She appears well-developed and well-nourished. No distress.  HENT:  Head: Normocephalic and atraumatic.  Eyes: Conjunctivae are normal. Pupils are equal, round, and reactive to light. No scleral icterus.  Neck: Normal range of motion.  Cardiovascular: Normal rate, regular rhythm, normal heart sounds, intact distal pulses and normal pulses.  Exam reveals no gallop and no friction rub.   No murmur heard. Pulmonary/Chest: Effort normal and breath sounds normal. No respiratory distress.  Abdominal: Soft. Bowel sounds are normal. She exhibits no distension and no mass. There is no tenderness. There is no guarding.  Neurological: She is alert and oriented to person, place, and time. She has normal strength.  Speech is clear and goal oriented, follows commands Major Cranial nerves without deficit, no facial droop Normal strength in upper and lower extremities bilaterally including dorsiflexion and plantar flexion, strong and equal grip strength Sensation normal to light and sharp touch Moves extremities without ataxia, coordination intact Normal finger to nose and rapid alternating movements Neg romberg, no pronator drift Normal gait Normal heel-shin and balance   Skin: Skin is warm and dry. She is not diaphoretic.    ED Course  Procedures (including critical care time) Labs Review Labs Reviewed  CBC  BASIC METABOLIC PANEL  Randolm Idol, ED   Imaging Review Dg Chest 2 View  02/15/2014   CLINICAL DATA:  Chest pain.  EXAM: CHEST  2 VIEW  COMPARISON:  12/17/2010  FINDINGS: Normal heart size. Chronic aortic tortuosity. No acute infiltrate or edema. No effusion or pneumothorax. No acute osseous findings.  IMPRESSION: No active cardiopulmonary disease.   Electronically Signed   By: Jorje Guild M.D.   On: 02/15/2014 22:06     EKG  Interpretation   Date/Time:  Tuesday February 15 2014 21:11:22 EDT Ventricular Rate:  89 PR Interval:  178 QRS Duration: 92 QT Interval:  373 QTC Calculation: 454 R Axis:   -61 Text Interpretation:  Age not entered, assumed to be  68 years old for  purpose of ECG interpretation Sinus rhythm Left anterior fascicular block  Consider anterior infarct Minimal ST depression Baseline wander in lead(s)  V4 Sinus rhythm Left anterior fasicular block ST \\T \ Artifact Abnormal ekg  Confirmed by Carmin Muskrat  MD [phone]) on 02/15/2014 11:16:40 PM      MDM   Final diagnoses:  None    Patient with EKG. Unchanged from previous. She has a  slight elevation in her serum creatinine since our last in 2012. Feel this is likely due to dehydration. I discussed verbally that the patient will need this lab rechecked within the Week after hydration. Her troponin s negative and cxr unchaged.    Patient is to be discharged with recommendation to follow up with PCP in regards to today's hospital visit. Chest pain is not likely of cardiac or pulmonary etiology d/t presentation, Wells low risk, VSS, no tracheal deviation, no JVD or new murmur, RRR, breath sounds equal bilaterally, EKG without acute abnormalities, negative troponin, and negative CXR. Pt has been advised start a PPI and return to the ED is CP becomes exertional, associated with diaphoresis or nausea, radiates to left jaw/arm, worsens or becomes concerning in any way. Pt appears reliable for follow up and is agreeable to discharge.   Case has been discussed with and seen by Dr. Zenia Resides who agrees with the above plan to discharge.       Margarita Mail, PA-C 02/17/14 1243

## 2014-02-15 NOTE — ED Notes (Signed)
Pt reports centralized CP while sitting in bed, states she ate late and has a hx of GERD so she sat up in bed, states she began to have tremors in her extremities, which she has been having intermittently for several months, states she became lightheaded and dizzy when turning her head from side to side, hx of vertigo, states she became ShOB, had back pain and felt confused. Pt a&o x4 at this time, skin warm and dry.

## 2014-02-15 NOTE — ED Provider Notes (Signed)
Medical screening examination/treatment/procedure(s) were conducted as a shared visit with non-physician practitioner(s) and myself.  I personally evaluated the patient during the encounter.   EKG Interpretation   Date/Time:  Tuesday February 15 2014 21:11:22 EDT Ventricular Rate:  89 PR Interval:  178 QRS Duration: 92 QT Interval:  373 QTC Calculation: 454 R Axis:   -61 Text Interpretation:  Age not entered, assumed to be  68 years old for  purpose of ECG interpretation Sinus rhythm Left anterior fascicular block  Consider anterior infarct Minimal ST depression Baseline wander in lead(s)  V4 Sinus rhythm Left anterior fasicular block ST \\T \ Artifact Abnormal ekg  Confirmed by Carmin Muskrat  MD [phone]) on 02/15/2014 11:16:40 PM     Patient here complaining of left upper quadrant pain after she ate. Does have a history of hiatal hernia as well as GERD and this is similar. No red flags for ACS. Patient has some tremors because she became nervous. Feels better at this time. Patient's EKG without signs of acute infarction. Do not think that the patient has ACS. She has stable for discharge  Leota Jacobsen, MD 02/15/14 2337

## 2014-02-18 NOTE — ED Provider Notes (Signed)
Medical screening examination/treatment/procedure(s) were conducted as a shared visit with non-physician practitioner(s) and myself.  I personally evaluated the patient during the encounter.   EKG Interpretation   Date/Time:  Tuesday February 15 2014 21:11:22 EDT Ventricular Rate:  89 PR Interval:  178 QRS Duration: 92 QT Interval:  373 QTC Calculation: 454 R Axis:   -61 Text Interpretation:  Age not entered, assumed to be  68 years old for  purpose of ECG interpretation Sinus rhythm Left anterior fascicular block  Consider anterior infarct Minimal ST depression Baseline wander in lead(s)  V4 Sinus rhythm Left anterior fasicular block ST \\T \ Artifact Abnormal ekg  Confirmed by Carmin Muskrat  MD ([id number]) on 02/15/2014 11:16:40 PM       Leota Jacobsen, MD 02/18/14 [phone]

## 2014-06-15 ENCOUNTER — Encounter: Payer: Self-pay | Admitting: Interventional Cardiology

## 2014-06-15 DIAGNOSIS — F419 Anxiety disorder, unspecified: Secondary | ICD-10-CM | POA: Insufficient documentation

## 2014-06-15 DIAGNOSIS — I10 Essential (primary) hypertension: Secondary | ICD-10-CM | POA: Insufficient documentation

## 2014-06-15 DIAGNOSIS — E785 Hyperlipidemia, unspecified: Secondary | ICD-10-CM | POA: Insufficient documentation
